# Patient Record
Sex: Male | Born: 1985 | Race: White | Hispanic: No | Marital: Single | Smoking: Current every day smoker
Health system: Southern US, Community
[De-identification: ages and names within clinical notes are randomized; demographics above are authoritative.]

## PROBLEM LIST (undated history)

## (undated) DIAGNOSIS — J45909 Unspecified asthma, uncomplicated: Secondary | ICD-10-CM

---
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
## 2007-08-10 ENCOUNTER — Emergency Department (HOSPITAL_COMMUNITY): Admission: EM | Admit: 2007-08-10 | Discharge: 2007-08-10 | Payer: Self-pay | Admitting: Emergency Medicine

## 2008-01-11 ENCOUNTER — Emergency Department (HOSPITAL_COMMUNITY): Admission: EM | Admit: 2008-01-11 | Discharge: 2008-01-11 | Payer: Self-pay | Admitting: Emergency Medicine

## 2009-01-23 ENCOUNTER — Emergency Department (HOSPITAL_COMMUNITY): Admission: EM | Admit: 2009-01-23 | Discharge: 2009-01-23 | Payer: Self-pay | Admitting: Emergency Medicine

## 2010-10-28 LAB — POCT I-STAT, CHEM 8
BUN: 9 mg/dL (ref 6–23)
HCT: 50 % (ref 39.0–52.0)
Hemoglobin: 17 g/dL (ref 13.0–17.0)
Sodium: 140 mEq/L (ref 135–145)
TCO2: 19 mmol/L (ref 0–100)

## 2010-10-28 LAB — PROTIME-INR
INR: 1.2 (ref 0.00–1.49)
Prothrombin Time: 15.1 seconds (ref 11.6–15.2)

## 2010-10-28 LAB — CBC
HCT: 47.1 % (ref 39.0–52.0)
Platelets: 206 10*3/uL (ref 150–400)
WBC: 10.2 10*3/uL (ref 4.0–10.5)

## 2011-04-12 LAB — CBC
MCV: 91.9
Platelets: 230
RBC: 4.81
WBC: 11.8 — ABNORMAL HIGH

## 2011-04-12 LAB — DIFFERENTIAL
Lymphocytes Relative: 20
Lymphs Abs: 2.4
Monocytes Relative: 11
Neutro Abs: 8 — ABNORMAL HIGH
Neutrophils Relative %: 68

## 2011-10-19 ENCOUNTER — Encounter (HOSPITAL_COMMUNITY): Payer: Self-pay | Admitting: Emergency Medicine

## 2011-10-19 ENCOUNTER — Emergency Department (HOSPITAL_COMMUNITY)
Admission: EM | Admit: 2011-10-19 | Discharge: 2011-10-19 | Disposition: A | Discharge status: Home / Self Care | Payer: Self-pay | Attending: Emergency Medicine | Admitting: Emergency Medicine

## 2011-10-19 ENCOUNTER — Emergency Department (HOSPITAL_COMMUNITY): Payer: Self-pay

## 2011-10-19 DIAGNOSIS — M25439 Effusion, unspecified wrist: Secondary | ICD-10-CM | POA: Insufficient documentation

## 2011-10-19 DIAGNOSIS — S62309A Unspecified fracture of unspecified metacarpal bone, initial encounter for closed fracture: Secondary | ICD-10-CM

## 2011-10-19 DIAGNOSIS — IMO0002 Reserved for concepts with insufficient information to code with codable children: Secondary | ICD-10-CM | POA: Insufficient documentation

## 2011-10-19 DIAGNOSIS — Z79899 Other long term (current) drug therapy: Secondary | ICD-10-CM | POA: Insufficient documentation

## 2011-10-19 DIAGNOSIS — S62319A Displaced fracture of base of unspecified metacarpal bone, initial encounter for closed fracture: Secondary | ICD-10-CM | POA: Insufficient documentation

## 2011-10-19 DIAGNOSIS — M25539 Pain in unspecified wrist: Secondary | ICD-10-CM | POA: Insufficient documentation

## 2011-10-19 DIAGNOSIS — M79609 Pain in unspecified limb: Secondary | ICD-10-CM | POA: Insufficient documentation

## 2011-10-19 DIAGNOSIS — M7989 Other specified soft tissue disorders: Secondary | ICD-10-CM | POA: Insufficient documentation

## 2011-10-19 MED ORDER — ONDANSETRON 4 MG PO TBDP
8.0000 mg | ORAL_TABLET | Freq: Once | ORAL | Status: AC
  Administered 2011-10-19: 8 mg via ORAL
  Filled 2011-10-19: qty 2

## 2011-10-19 MED ORDER — HYDROCODONE-ACETAMINOPHEN 5-325 MG PO TABS
1.0000 | ORAL_TABLET | Freq: Once | ORAL | Status: AC
  Administered 2011-10-19: 1 via ORAL
  Filled 2011-10-19 (×2): qty 1

## 2011-10-19 MED ORDER — HYDROCODONE-ACETAMINOPHEN 5-325 MG PO TABS: ORAL_TABLET | ORAL | Status: AC

## 2011-10-19 MED ORDER — AMOXICILLIN-POT CLAVULANATE 875-125 MG PO TABS: 1.0000 | ORAL_TABLET | Freq: Two times a day (BID) | ORAL | Status: AC

## 2011-10-19 NOTE — ED Notes (Signed)
Ice applied
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
## 2011-10-19 NOTE — Progress Notes (Signed)
Orthopedic Tech Progress Note Patient Details:  Neko Boyajian July 20, 1986 [id number]  Type of Splint: Other (comment) Splint Interventions: Application    Kaylan Friedmann T 10/19/2011, 1:14 PM

## 2011-10-19 NOTE — Discharge Instructions (Signed)
Hand Fracture Your caregiver has diagnosed you with a fractured (broken) bone in your hand. If the bones are in good position and the hand is properly immobilized and rested, these injuries will usually heal in 3 to 6 weeks. A cast, splint, or bulky bandage is usually applied to keep the fracture site from moving. Do not remove the splint or cast until your caregiver approves. If the fracture is unstable or the bones are not aligned properly, surgery may be needed. Keep your hand raised (elevated) above the level of your heart as much as possible for the next 2 to 3 days until the swelling and pain are better. Apply ice packs for 15 to 20 minutes every 3 to 4 hours to help control the pain and swelling. See your caregiver or an orthopedic specialist as directed for follow-up care to make sure the fracture is beginning to heal properly. SEEK IMMEDIATE MEDICAL CARE IF:   You notice your fingers are cold, numb, crooked, or the pain of your injury is severe.   You are not improving or seem to be getting worse.   You have questions or concerns.  Document Released: 08/15/2004 Document Revised: 06/27/2011 Document Reviewed: 01/03/2009 [facility] Patient Information 2012 [place], [place].   Narcotic and benzodiazepine use may cause drowsiness, slowed breathing or dependence.  Please use with caution and do not drive, operate machinery or watch young children alone while taking them.  Taking combinations of these medications or drinking alcohol will potentiate these effects.

## 2013-07-12 ENCOUNTER — Emergency Department (HOSPITAL_COMMUNITY): Payer: Self-pay

## 2013-07-12 ENCOUNTER — Emergency Department (HOSPITAL_COMMUNITY)
Admission: EM | Admit: 2013-07-12 | Discharge: 2013-07-12 | Disposition: A | Discharge status: Home / Self Care | Payer: Self-pay | Attending: Emergency Medicine | Admitting: Emergency Medicine

## 2013-07-12 ENCOUNTER — Encounter (HOSPITAL_COMMUNITY): Payer: Self-pay | Admitting: Emergency Medicine

## 2013-07-12 DIAGNOSIS — R109 Unspecified abdominal pain: Secondary | ICD-10-CM | POA: Insufficient documentation

## 2013-07-12 DIAGNOSIS — K625 Hemorrhage of anus and rectum: Secondary | ICD-10-CM | POA: Diagnosis present

## 2013-07-12 DIAGNOSIS — R112 Nausea with vomiting, unspecified: Secondary | ICD-10-CM | POA: Insufficient documentation

## 2013-07-12 DIAGNOSIS — F17200 Nicotine dependence, unspecified, uncomplicated: Secondary | ICD-10-CM | POA: Insufficient documentation

## 2013-07-12 DIAGNOSIS — K921 Melena: Secondary | ICD-10-CM | POA: Insufficient documentation

## 2013-07-12 DIAGNOSIS — R071 Chest pain on breathing: Secondary | ICD-10-CM | POA: Insufficient documentation

## 2013-07-12 DIAGNOSIS — B349 Viral infection, unspecified: Secondary | ICD-10-CM | POA: Diagnosis present

## 2013-07-12 DIAGNOSIS — R0789 Other chest pain: Secondary | ICD-10-CM

## 2013-07-12 DIAGNOSIS — R197 Diarrhea, unspecified: Secondary | ICD-10-CM | POA: Insufficient documentation

## 2013-07-12 DIAGNOSIS — R6883 Chills (without fever): Secondary | ICD-10-CM | POA: Insufficient documentation

## 2013-07-12 LAB — CBC WITH DIFFERENTIAL/PLATELET
Eosinophils Absolute: 0 10*3/uL (ref 0.0–0.7)
Eosinophils Relative: 0 % (ref 0–5)
HCT: 43.6 % (ref 39.0–52.0)
Hemoglobin: 15.1 g/dL (ref 13.0–17.0)
Lymphocytes Relative: 3 % — ABNORMAL LOW (ref 12–46)
Lymphs Abs: 0.4 10*3/uL — ABNORMAL LOW (ref 0.7–4.0)
MCH: 31.9 pg (ref 26.0–34.0)
MCV: 92.2 fL (ref 78.0–100.0)
Monocytes Relative: 8 % (ref 3–12)
RBC: 4.73 MIL/uL (ref 4.22–5.81)
WBC: 13.2 10*3/uL — ABNORMAL HIGH (ref 4.0–10.5)

## 2013-07-12 LAB — URINALYSIS W MICROSCOPIC + REFLEX CULTURE
Bilirubin Urine: NEGATIVE
Hgb urine dipstick: NEGATIVE
Nitrite: NEGATIVE
Protein, ur: NEGATIVE mg/dL
Specific Gravity, Urine: 1.018 (ref 1.005–1.030)
Urobilinogen, UA: 0.2 mg/dL (ref 0.0–1.0)

## 2013-07-12 LAB — COMPREHENSIVE METABOLIC PANEL
ALT: 10 U/L (ref 0–53)
Alkaline Phosphatase: 64 U/L (ref 39–117)
BUN: 12 mg/dL (ref 6–23)
CO2: 19 mEq/L (ref 19–32)
Calcium: 8.4 mg/dL (ref 8.4–10.5)
GFR calc Af Amer: >90 mL/min (ref >90)
GFR calc non Af Amer: 80 mL/min — ABNORMAL LOW (ref >90)
Glucose, Bld: 90 mg/dL (ref 70–99)
Sodium: 136 mEq/L (ref 135–145)
Total Protein: 6.8 g/dL (ref 6.0–8.3)

## 2013-07-12 LAB — OCCULT BLOOD, POC DEVICE: Fecal Occult Bld: NEGATIVE

## 2013-07-12 LAB — LIPASE, BLOOD: Lipase: 26 U/L (ref 11–59)

## 2013-07-12 MED ORDER — OXYCODONE-ACETAMINOPHEN 5-325 MG PO TABS: 1.0000 | ORAL_TABLET | Freq: Four times a day (QID) | ORAL | Status: DC | PRN

## 2013-07-12 MED ORDER — SODIUM CHLORIDE 0.9 % IV BOLUS (SEPSIS)
1000.0000 mL | Freq: Once | INTRAVENOUS | Status: AC
  Administered 2013-07-12: 1000 mL via INTRAVENOUS

## 2013-07-12 MED ORDER — ONDANSETRON HCL 4 MG/2ML IJ SOLN
4.0000 mg | Freq: Once | INTRAMUSCULAR | Status: AC
  Administered 2013-07-12: 4 mg via INTRAVENOUS
  Filled 2013-07-12: qty 2

## 2013-07-12 MED ORDER — HYDROMORPHONE HCL PF 1 MG/ML IJ SOLN
1.0000 mg | Freq: Once | INTRAMUSCULAR | Status: AC
  Administered 2013-07-12: 1 mg via INTRAVENOUS
  Filled 2013-07-12: qty 1

## 2013-07-12 MED ORDER — ONDANSETRON 4 MG PO TBDP: ORAL_TABLET | ORAL | Status: DC

## 2013-07-12 NOTE — ED Provider Notes (Signed)
CSN: [id number]     Arrival date & time 07/12/13  1354 History   First MD Initiated Contact with Patient 07/12/13 1459     Chief Complaint  Patient presents with  . Chest Pain  . Emesis  . Diarrhea   (Consider location/radiation/quality/duration/timing/severity/associated sxs/prior Treatment) Patient is a 27 y.o. male presenting with chest pain, vomiting, and diarrhea. The history is provided by the patient.  Chest Pain Pain location:  R lateral chest Pain quality: aching   Radiates to: right chest. Pain radiates to the back: no   Pain severity:  Moderate Onset quality:  Gradual Duration:  3 weeks Timing:  Constant Progression:  Unchanged Chronicity:  New Context: not breathing   Relieved by:  Nothing Worsened by:  Nothing tried Ineffective treatments:  None tried Associated symptoms: abdominal pain (occasional cramping), nausea and vomiting   Associated symptoms: no cough, no fever, no headache, no numbness and no shortness of breath   Emesis Associated symptoms: abdominal pain (occasional cramping), chills and diarrhea   Associated symptoms: no headaches   Diarrhea Associated symptoms: abdominal pain (occasional cramping), chills and vomiting   Associated symptoms: no fever and no headaches     History reviewed. No pertinent past medical history. History reviewed. No pertinent past surgical history. No family history on file. History  Substance Use Topics  . Smoking status: Current Every Day Smoker -- 1.00 packs/day    Types: Cigarettes  . Smokeless tobacco: Not on file  . Alcohol Use: No     Comment: occass.    Review of Systems  Constitutional: Positive for chills. Negative for fever.  HENT: Negative for drooling and rhinorrhea.   Eyes: Negative for pain.  Respiratory: Negative for cough and shortness of breath.   Cardiovascular: Positive for chest pain. Negative for leg swelling.  Gastrointestinal: Positive for nausea, vomiting, abdominal pain (occasional  cramping) and diarrhea.  Genitourinary: Negative for dysuria and hematuria.  Musculoskeletal: Negative for gait problem and neck pain.  Skin: Negative for color change.  Neurological: Negative for numbness and headaches.  Hematological: Negative for adenopathy.  Psychiatric/Behavioral: Negative for behavioral problems.  All other systems reviewed and are negative.    Allergies  Review of patient's allergies indicates no known allergies.  Home Medications  No current outpatient prescriptions on file. BP 114/61  Pulse 74  Temp(Src) 98 F (36.7 C) (Oral)  Resp 20  SpO2 100% Physical Exam  Nursing note and vitals reviewed. Constitutional: He is oriented to person, place, and time. He appears well-developed and well-nourished.  HENT:  Head: Normocephalic and atraumatic.  Right Ear: External ear normal.  Left Ear: External ear normal.  Nose: Nose normal.  Mouth/Throat: Oropharynx is clear and moist. No oropharyngeal exudate.  Eyes: Conjunctivae and EOM are normal. Pupils are equal, round, and reactive to light.  Neck: Normal range of motion. Neck supple.  Cardiovascular: Normal rate, regular rhythm, normal heart sounds and intact distal pulses.  Exam reveals no gallop and no friction rub.   No murmur heard. Pulmonary/Chest: Effort normal and breath sounds normal. No respiratory distress. He has no wheezes. He exhibits tenderness (moderate right lateral chest and axillary tenderness to palpation on exam.).  Abdominal: Soft. Bowel sounds are normal. He exhibits no distension. There is no tenderness. There is no rebound and no guarding.  Musculoskeletal: Normal range of motion. He exhibits no edema and no tenderness.  Neurological: He is alert and oriented to person, place, and time.  Skin: Skin is warm and dry.  Psychiatric: He has a normal mood and affect. His behavior is normal.    ED Course  Procedures (including critical care time) Labs Review Labs Reviewed  CBC WITH  DIFFERENTIAL - Abnormal; Notable for the following:    WBC 13.2 (*)    Neutrophils Relative % 88 (*)    Neutro Abs 11.7 (*)    Lymphocytes Relative 3 (*)    Lymphs Abs 0.4 (*)    Monocytes Absolute 1.1 (*)    All other components within normal limits  COMPREHENSIVE METABOLIC PANEL - Abnormal; Notable for the following:    GFR calc non Af Amer 80 (*)    All other components within normal limits  LIPASE, BLOOD  URINALYSIS W MICROSCOPIC + REFLEX CULTURE  OCCULT BLOOD, POC DEVICE   Imaging Review Dg Chest 2 View  07/12/2013   CLINICAL DATA:  Right chest pain  EXAM: CHEST  2 VIEW  COMPARISON:  01/23/2009  FINDINGS: The heart size and mediastinal contours are within normal limits. Both lungs are clear. The visualized skeletal structures are unremarkable.  IMPRESSION: No active cardiopulmonary disease.   Electronically Signed   By: Ruel Favors M.D.   On: 07/12/2013 15:46    EKG Interpretation    Date/Time:  Monday July 12 2013 14:06:53 EST Ventricular Rate:  73 PR Interval:  140 QRS Duration: 92 QT Interval:  512 QTC Calculation: 564 R Axis:   91 Text Interpretation:  Sinus rhythm Borderline right axis deviation Prolonged QT interval No old tracing to compare Confirmed by GOLDSTON  MD, SCOTT ([id number]) on 07/12/2013 2:37:51 PM            MDM   1. Chest wall pain   2. Rectal bleeding   3. Viral syndrome    3:19 PM 27 y.o. male who presents with right lateral chest pain and bloody stools for approximately 3 weeks. The patient states that he has had dark red and some moderate blood with stooling for the last 3 weeks. He also complains of constant right axillary and right lateral chest pain which has been unchanged for the last 3 weeks. He denies any fevers. He woke early this morning with vomiting and diarrhea. He notes that he has intermittent abdominal cramping but is not having this currently on exam. He denies any shortness of breath. He is afebrile and vital signs are  unremarkable here. His abdomen is benign. His right lateral chest pain is easily reproduced with palpation on exam. Possibly musculoskeletal in nature. Although patient denies injury. Wells/perc neg. Will get screening lab work, chest x-ray, IV fluid, and pain control.  5:51 PM: Pt continues to appear well. Mild non-spec elev in wbc, likely related to current viral syndrome. hemeoccult neg. Will have him f/u w/ gi. CP likely msk in nature as it is easily reproduced on exam, will give percocet for pain control at home.  I have discussed the diagnosis/risks/treatment options with the patient and family and believe the pt to be eligible for discharge home to follow-up with gi as outpt. We also discussed returning to the ED immediately if new or worsening sx occur. We discussed the sx which are most concerning (e.g., worsening rectal bleeding, abd pain, fever, inability to tolerate po) that necessitate immediate return. Any new prescriptions provided to the patient are listed below.  New Prescriptions   ONDANSETRON (ZOFRAN ODT) 4 MG DISINTEGRATING TABLET    4mg  ODT q4 hours prn nausea/vomit   OXYCODONE-ACETAMINOPHEN (PERCOCET) 5-325 MG PER TABLET  Take 1 tablet by mouth every 6 (six) hours as needed for moderate pain.       Junius Argyle, MD 07/12/13 [phone]

## 2013-07-12 NOTE — ED Notes (Signed)
Pt arrived by [facility] with c/o right sided CP that started 3 weeks ago. CP radiates to right axilla. Around 0200 this morning pt started having N/V/D and blood in stool. EMS administered Zofran 4mg  which helped with nausea. A total of NS was administered. NSR on the monitor. Also c/o chills.

## 2013-07-12 NOTE — ED Notes (Signed)
Patient could not get an urine sample

## 2014-02-09 ENCOUNTER — Emergency Department (HOSPITAL_BASED_OUTPATIENT_CLINIC_OR_DEPARTMENT_OTHER)
Admission: EM | Admit: 2014-02-09 | Discharge: 2014-02-09 | Disposition: A | Discharge status: Home / Self Care | Payer: Self-pay | Attending: Emergency Medicine | Admitting: Emergency Medicine

## 2014-02-09 ENCOUNTER — Emergency Department (HOSPITAL_BASED_OUTPATIENT_CLINIC_OR_DEPARTMENT_OTHER): Payer: Self-pay

## 2014-02-09 ENCOUNTER — Encounter (HOSPITAL_BASED_OUTPATIENT_CLINIC_OR_DEPARTMENT_OTHER): Payer: Self-pay | Admitting: Emergency Medicine

## 2014-02-09 DIAGNOSIS — R1030 Lower abdominal pain, unspecified: Secondary | ICD-10-CM

## 2014-02-09 DIAGNOSIS — R109 Unspecified abdominal pain: Secondary | ICD-10-CM | POA: Insufficient documentation

## 2014-02-09 DIAGNOSIS — F17200 Nicotine dependence, unspecified, uncomplicated: Secondary | ICD-10-CM | POA: Insufficient documentation

## 2014-02-09 DIAGNOSIS — R1033 Periumbilical pain: Secondary | ICD-10-CM | POA: Insufficient documentation

## 2014-02-09 DIAGNOSIS — Z79899 Other long term (current) drug therapy: Secondary | ICD-10-CM | POA: Insufficient documentation

## 2014-02-09 LAB — COMPREHENSIVE METABOLIC PANEL
ALBUMIN: 3.9 g/dL (ref 3.5–5.2)
ALK PHOS: 76 U/L (ref 39–117)
ALT: 9 U/L (ref 0–53)
AST: 19 U/L (ref 0–37)
Anion gap: 14 (ref 5–15)
BUN: 11 mg/dL (ref 6–23)
CO2: 22 mEq/L (ref 19–32)
Calcium: 9.4 mg/dL (ref 8.4–10.5)
Chloride: 106 mEq/L (ref 96–112)
Creatinine, Ser: 1.2 mg/dL (ref 0.50–1.35)
GFR calc non Af Amer: 83 mL/min — ABNORMAL LOW (ref >90)
GLUCOSE: 92 mg/dL (ref 70–99)
POTASSIUM: 4.1 meq/L (ref 3.7–5.3)
SODIUM: 142 meq/L (ref 137–147)
TOTAL PROTEIN: 7 g/dL (ref 6.0–8.3)
Total Bilirubin: 0.4 mg/dL (ref 0.3–1.2)

## 2014-02-09 LAB — RAPID URINE DRUG SCREEN, HOSP PERFORMED
AMPHETAMINES: NOT DETECTED
BARBITURATES: NOT DETECTED
BENZODIAZEPINES: POSITIVE — AB
Cocaine: POSITIVE — AB
Opiates: NOT DETECTED
TETRAHYDROCANNABINOL: POSITIVE — AB

## 2014-02-09 LAB — CBC WITH DIFFERENTIAL/PLATELET
BASOS ABS: 0 10*3/uL (ref 0.0–0.1)
Basophils Relative: 0 % (ref 0–1)
EOS ABS: 0.1 10*3/uL (ref 0.0–0.7)
Eosinophils Relative: 1 % (ref 0–5)
HCT: 43.3 % (ref 39.0–52.0)
Hemoglobin: 15.2 g/dL (ref 13.0–17.0)
LYMPHS ABS: 1.6 10*3/uL (ref 0.7–4.0)
Lymphocytes Relative: 15 % (ref 12–46)
MCH: 32.4 pg (ref 26.0–34.0)
MCHC: 35.1 g/dL (ref 30.0–36.0)
MCV: 92.3 fL (ref 78.0–100.0)
Monocytes Absolute: 1 10*3/uL (ref 0.1–1.0)
Monocytes Relative: 9 % (ref 3–12)
Neutro Abs: 8 10*3/uL — ABNORMAL HIGH (ref 1.7–7.7)
Neutrophils Relative %: 75 % (ref 43–77)
PLATELETS: 211 10*3/uL (ref 150–400)
RBC: 4.69 MIL/uL (ref 4.22–5.81)
RDW: 13.2 % (ref 11.5–15.5)
WBC: 10.6 10*3/uL — ABNORMAL HIGH (ref 4.0–10.5)

## 2014-02-09 LAB — URINALYSIS, ROUTINE W REFLEX MICROSCOPIC
BILIRUBIN URINE: NEGATIVE
GLUCOSE, UA: NEGATIVE mg/dL
HGB URINE DIPSTICK: NEGATIVE
Ketones, ur: NEGATIVE mg/dL
Leukocytes, UA: NEGATIVE
Nitrite: NEGATIVE
PH: 5.5 (ref 5.0–8.0)
Protein, ur: NEGATIVE mg/dL
Specific Gravity, Urine: 1.015 (ref 1.005–1.030)
Urobilinogen, UA: 0.2 mg/dL (ref 0.0–1.0)

## 2014-02-09 LAB — OCCULT BLOOD X 1 CARD TO LAB, STOOL: Fecal Occult Bld: NEGATIVE

## 2014-02-09 MED ORDER — SODIUM CHLORIDE 0.9 % IV BOLUS (SEPSIS)
1000.0000 mL | Freq: Once | INTRAVENOUS | Status: AC
  Administered 2014-02-09: 1000 mL via INTRAVENOUS

## 2014-02-09 MED ORDER — ONDANSETRON HCL 4 MG/2ML IJ SOLN
4.0000 mg | Freq: Once | INTRAMUSCULAR | Status: AC
  Administered 2014-02-09: 4 mg via INTRAVENOUS
  Filled 2014-02-09: qty 2

## 2014-02-09 MED ORDER — KETOROLAC TROMETHAMINE 30 MG/ML IJ SOLN
30.0000 mg | Freq: Once | INTRAMUSCULAR | Status: AC
  Administered 2014-02-09: 30 mg via INTRAVENOUS
  Filled 2014-02-09: qty 1

## 2014-02-09 MED ORDER — IOHEXOL 300 MG/ML  SOLN
50.0000 mL | Freq: Once | INTRAMUSCULAR | Status: AC | PRN
  Administered 2014-02-09: 50 mL via ORAL

## 2014-02-09 MED ORDER — IOHEXOL 300 MG/ML  SOLN
100.0000 mL | Freq: Once | INTRAMUSCULAR | Status: AC | PRN
  Administered 2014-02-09: 100 mL via INTRAVENOUS

## 2014-02-09 NOTE — ED Notes (Signed)
Pt was up to BR after EDP exam and did not obtain urine sample-pt advised due to c/o of blood in urine, urine studies are ordered-given urinal and advised to let staff know when voids

## 2014-02-09 NOTE — ED Provider Notes (Signed)
CSN: [id number]     Arrival date & time 02/09/14  1106 History   First MD Initiated Contact with Patient 02/09/14 1132     Chief Complaint  Patient presents with  . Abdominal Pain     (Consider location/radiation/quality/duration/timing/severity/associated sxs/prior Treatment) HPI Comments: Patient is a 28 year old male with no past medical or surgical history. He presents today with complaints of severe lower abdominal pain that he has had for the past 5 days. He was seen at an outside facility 2 days ago and had extensive workup, however no cause was found. He presents here stating that he is having bloody stools, blood in his urine, and blood when he coughs. He denies any ill contacts.  Patient is a 28 y.o. male presenting with abdominal pain. The history is provided by the patient.  Abdominal Pain Pain location:  Suprapubic and periumbilical Pain quality: cramping   Pain radiates to:  Does not radiate Pain severity:  Severe Onset quality:  Gradual Duration:  5 days Timing:  Constant Progression:  Worsening Chronicity:  New Relieved by:  Nothing Worsened by:  Nothing tried Ineffective treatments:  None tried   History reviewed. No pertinent past medical history. History reviewed. No pertinent past surgical history. No family history on file. History  Substance Use Topics  . Smoking status: Current Every Day Smoker -- 1.00 packs/day    Types: Cigarettes  . Smokeless tobacco: Not on file  . Alcohol Use: Yes    Review of Systems  Gastrointestinal: Positive for abdominal pain.  All other systems reviewed and are negative.     Allergies  Review of patient's allergies indicates no known allergies.  Home Medications   Prior to Admission medications   Medication Sig Start Date End Date Taking? Authorizing Provider  dicyclomine (BENTYL) 20 MG tablet Take 20 mg by mouth every 6 (six) hours.   Yes Historical Provider, MD  HYDROcodone-acetaminophen (NORCO/VICODIN) 5-325  MG per tablet Take 1 tablet by mouth every 6 (six) hours as needed for moderate pain.   Yes Historical Provider, MD  polyethylene glycol (MIRALAX / GLYCOLAX) packet Take 17 g by mouth daily.   Yes Historical Provider, MD  promethazine (PHENERGAN) 25 MG tablet Take 25 mg by mouth every 6 (six) hours as needed for nausea or vomiting.   Yes Historical Provider, MD  ondansetron (ZOFRAN ODT) 4 MG disintegrating tablet 4mg  ODT q4 hours prn nausea/vomit 07/12/13   Junius Argyle, MD  oxyCODONE-acetaminophen (PERCOCET) 5-325 MG per tablet Take 1 tablet by mouth every 6 (six) hours as needed for moderate pain. 07/12/13   Junius Argyle, MD   BP 138/81  Pulse 82  Temp(Src) 97.8 F (36.6 C) (Oral)  Resp 16  Ht 5\' 8"  (1.727 m)  Wt 155 lb (70.308 kg)  BMI 23.57 kg/m2  SpO2 98% Physical Exam  Nursing note and vitals reviewed. Constitutional: He is oriented to person, place, and time. He appears well-developed and well-nourished. No distress.  HENT:  Head: Normocephalic and atraumatic.  Mouth/Throat: Oropharynx is clear and moist.  Neck: Normal range of motion. Neck supple.  Abdominal: Soft. Bowel sounds are normal. He exhibits no distension. There is tenderness. There is no rebound and no guarding.  There is tenderness to palpation in the area umbilical and suprapubic region.  Musculoskeletal: Normal range of motion. He exhibits no edema.  Lymphadenopathy:    He has no cervical adenopathy.  Neurological: He is alert and oriented to person, place, and time.  Skin: Skin is  warm and dry. He is not diaphoretic.    ED Course  Procedures (including critical care time) Labs Review Labs Reviewed  OCCULT BLOOD X 1 CARD TO LAB, STOOL  CBC WITH DIFFERENTIAL  COMPREHENSIVE METABOLIC PANEL  URINALYSIS, ROUTINE W REFLEX MICROSCOPIC  URINE RAPID DRUG SCREEN (HOSP PERFORMED)    Imaging Review No results found.   EKG Interpretation None      MDM   Final diagnoses:  None    Patient  presents with complaints of severe lower abdominal pain that has been ongoing for the past 4 days. He had a CT scan performed at [facility] 2 days ago which was unremarkable. He presents here with complaints of ongoing discomfort that is unrelieved with the medications he was prescribed there. Physical examination reveals tenderness in the suprapubic region. CAT scan reveals no acute intra-abdominal process and laboratory studies are essentially unremarkable. He tells me he is having bloody stool, however he is Hemoccult negative. He is also telling me he is having blood in his urine, however his urinalysis is clear. He was given IV fluids and Toradol with little relief. As the workup has been negative, I see no indication for admission or surgical consultation. I will advise him to continue the medications which were prescribed at the outside facility. These were hydrocodone, MiraLax, and Bentyl.    Danny Lyonsouglas Shira Bobst, MD 02/09/14 1450

## 2014-02-09 NOTE — ED Notes (Signed)
MD at bedside. 

## 2014-02-09 NOTE — Discharge Instructions (Signed)
Continue your medications as previously prescribed.   Followup with your primary Dr. if not improving in the next 2 days. Return to the emergency department if you develop worsening pain, high fever, or other new and concerning symptoms.    Abdominal Pain Many things can cause abdominal pain. Usually, abdominal pain is not caused by a disease and will improve without treatment. It can often be observed and treated at home. Your health care provider will do a physical exam and possibly order blood tests and X-rays to help determine the seriousness of your pain. However, in many cases, more time must pass before a clear cause of the pain can be found. Before that point, your health care provider may not know if you need more testing or further treatment. HOME CARE INSTRUCTIONS  Monitor your abdominal pain for any changes. The following actions may help to alleviate any discomfort you are experiencing:  Only take over-the-counter or prescription medicines as directed by your health care provider.  Do not take laxatives unless directed to do so by your health care provider.  Try a clear liquid diet (broth, tea, or water) as directed by your health care provider. Slowly move to a bland diet as tolerated. SEEK MEDICAL CARE IF:  You have unexplained abdominal pain.  You have abdominal pain associated with nausea or diarrhea.  You have pain when you urinate or have a bowel movement.  You experience abdominal pain that wakes you in the night.  You have abdominal pain that is worsened or improved by eating food.  You have abdominal pain that is worsened with eating fatty foods.  You have a fever. SEEK IMMEDIATE MEDICAL CARE IF:   Your pain does not go away within 2 hours.  You keep throwing up (vomiting).  Your pain is felt only in portions of the abdomen, such as the right side or the left lower portion of the abdomen.  You pass bloody or black tarry stools. MAKE SURE YOU:  Understand  these instructions.   Will watch your condition.   Will get help right away if you are not doing well or get worse.  Document Released: 04/17/2005 Document Revised: 07/13/2013 Document Reviewed: 03/17/2013 [facility] Patient Information 2015 [place], [place]. This information is not intended to replace advice given to you by your health care provider. Make sure you discuss any questions you have with your health care provider.

## 2014-02-09 NOTE — ED Notes (Signed)
abd pain with bloody stools since July 18-[facility]multiple rx no relief

## 2014-02-09 NOTE — ED Notes (Signed)
Patient transported to CT 

## 2014-04-10 ENCOUNTER — Emergency Department (HOSPITAL_COMMUNITY)
Admission: EM | Admit: 2014-04-10 | Discharge: 2014-04-10 | Payer: Self-pay | Attending: Emergency Medicine | Admitting: Emergency Medicine

## 2014-04-10 ENCOUNTER — Encounter (HOSPITAL_COMMUNITY): Payer: Self-pay | Admitting: Emergency Medicine

## 2014-04-10 DIAGNOSIS — S0181XA Laceration without foreign body of other part of head, initial encounter: Secondary | ICD-10-CM

## 2014-04-10 DIAGNOSIS — S0990XA Unspecified injury of head, initial encounter: Secondary | ICD-10-CM | POA: Insufficient documentation

## 2014-04-10 DIAGNOSIS — F17200 Nicotine dependence, unspecified, uncomplicated: Secondary | ICD-10-CM | POA: Insufficient documentation

## 2014-04-10 DIAGNOSIS — J45909 Unspecified asthma, uncomplicated: Secondary | ICD-10-CM | POA: Insufficient documentation

## 2014-04-10 DIAGNOSIS — S298XXA Other specified injuries of thorax, initial encounter: Secondary | ICD-10-CM | POA: Insufficient documentation

## 2014-04-10 DIAGNOSIS — Z23 Encounter for immunization: Secondary | ICD-10-CM | POA: Insufficient documentation

## 2014-04-10 DIAGNOSIS — S0180XA Unspecified open wound of other part of head, initial encounter: Secondary | ICD-10-CM | POA: Insufficient documentation

## 2014-04-10 HISTORY — DX: Unspecified asthma, uncomplicated: J45.909

## 2014-04-10 MED ORDER — TETANUS-DIPHTH-ACELL PERTUSSIS 5-2.5-18.5 LF-MCG/0.5 IM SUSP
0.5000 mL | Freq: Once | INTRAMUSCULAR | Status: AC
  Administered 2014-04-10: 0.5 mL via INTRAMUSCULAR
  Filled 2014-04-10: qty 0.5

## 2014-04-10 MED ORDER — LIDOCAINE-EPINEPHRINE-TETRACAINE (LET) SOLUTION
NASAL | Status: AC
  Filled 2014-04-10: qty 3

## 2014-04-10 MED ORDER — LIDOCAINE-EPINEPHRINE-TETRACAINE (LET) SOLUTION: 3.0000 mL | Freq: Once | NASAL | Status: DC

## 2014-04-10 MED ORDER — AMMONIA AROMATIC IN INHA
RESPIRATORY_TRACT | Status: AC
  Filled 2014-04-10: qty 10

## 2014-04-10 MED ORDER — LIDOCAINE HCL (PF) 1 % IJ SOLN
5.0000 mL | Freq: Once | INTRAMUSCULAR | Status: AC
  Administered 2014-04-10: 5 mL
  Filled 2014-04-10: qty 5

## 2014-04-10 NOTE — ED Notes (Signed)
Attempted to clean around eye, pt is refusing stating it hurts too much.
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
## 2014-04-10 NOTE — Discharge Instructions (Signed)
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
